# Patient Record
Sex: Female | Born: 1995 | Race: White | Hispanic: No | Marital: Single | Smoking: Never smoker
Health system: Southern US, Community
[De-identification: ages and names within clinical notes are randomized; demographics above are authoritative.]

## PROBLEM LIST (undated history)

## (undated) DIAGNOSIS — F909 Attention-deficit hyperactivity disorder, unspecified type: Secondary | ICD-10-CM

## (undated) DIAGNOSIS — R51 Headache: Secondary | ICD-10-CM

## (undated) DIAGNOSIS — T7840XA Allergy, unspecified, initial encounter: Secondary | ICD-10-CM

## (undated) HISTORY — DX: Headache: R51

## (undated) HISTORY — DX: Allergy, unspecified, initial encounter: T78.40XA

## (undated) HISTORY — PX: WISDOM TOOTH EXTRACTION: SHX21

## (undated) HISTORY — DX: Attention-deficit hyperactivity disorder, unspecified type: F90.9

## (undated) HISTORY — PX: TYMPANOSTOMY TUBE PLACEMENT: SHX32

---
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
## 2015-01-08 ENCOUNTER — Other Ambulatory Visit: Payer: Self-pay | Admitting: Family

## 2015-04-04 ENCOUNTER — Encounter: Payer: Self-pay | Admitting: Neurology

## 2015-04-04 ENCOUNTER — Ambulatory Visit (INDEPENDENT_AMBULATORY_CARE_PROVIDER_SITE_OTHER): Payer: BLUE CROSS/BLUE SHIELD | Admitting: Neurology

## 2015-04-04 VITALS — BP 112/64 | Ht 68.0 in | Wt 112.4 lb

## 2015-04-04 DIAGNOSIS — G44209 Tension-type headache, unspecified, not intractable: Secondary | ICD-10-CM | POA: Diagnosis not present

## 2015-04-04 DIAGNOSIS — M6248 Contracture of muscle, other site: Secondary | ICD-10-CM | POA: Diagnosis not present

## 2015-04-04 DIAGNOSIS — M62838 Other muscle spasm: Secondary | ICD-10-CM

## 2015-04-04 MED ORDER — CYCLOBENZAPRINE HCL 5 MG PO TABS: ORAL_TABLET | ORAL | Status: DC

## 2015-04-04 NOTE — Progress Notes (Signed)
Patient: Ana Bowen MRN: [id number] Sex: female DOB: 06-Jan-1996  Provider: Keturah ShaversNABIZADEH, Yamin Swingler, MD [facility]  Note type: Routine return visit  Referral Source: Dr. Juluis RainierElizabeth Barnes [facility]: [facility] and mother Chief Complaint: Tension headaches  History of Present Illness: Ana Bowen is a 19 y.o. female is here for follow-up management of headache and muscle spasm. She has history of migraine and tension-type headaches as well as anxiety and cervical muscle spasm for which she has been on treatment with Flexeril as a muscle relaxant and also she was on headache preventive medication the past but it was discontinued since she was doing significantly better with no headaches. She was last seen in February 2016 when at that point the headache preventive medication was already discontinued.  Over the past several months she has been doing fairly well with very low-dose of Flexeril at 5 mg every night with occasional headaches but she has been having occasional episodes of upper and lower back pain with mostly paraspinal muscle spasm particularly when she was not taking Flexeril. She was seen by orthopedic service at some point without any specific findings. She ran out of Flexeril last month and since then she was having more muscle spasm in her paraspinal area upper and lower as well as more frequent tension type headaches for which she had to take OTC medications frequently. She does not have any other symptoms with the headaches such as nausea or vomiting, blurry vision or photophobia. She is having episodes of anxiety issues and stress. She is able to sleep well through the night. She has had no recent sports injury or trauma. She is doing fairly well academically at school and has no other concerns.  Review of Systems: 12 system review as per HPI, otherwise negative.  Past Medical History  Diagnosis Date  . Allergy   .  Headache(784.0)   . ADHD (attention deficit hyperactivity disorder)    Surgical History Past Surgical History  Procedure Laterality Date  . Tympanostomy tube placement      Family History family history includes ADD / ADHD in her mother; Anxiety disorder in her paternal grandmother; Cancer in her paternal grandfather; Depression in her other and paternal grandmother; Heart disease in her maternal grandfather; Migraines in her paternal grandmother.  Social History Social History   Social History  . Marital Status: Single    Spouse Name: N/A  . Number of Children: N/A  . Years of Education: N/A   Social History Main Topics  . Smoking status: Never Smoker   . Smokeless tobacco: Never Used  . Alcohol Use: No  . Drug Use: No  . Sexual Activity: No   Other Topics Concern  . 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 She lives on campus and spends school breaks at her parent's home. She has a younger sister.     The medication list was reviewed and reconciled. All changes or newly prescribed medications were explained.  A complete medication list was provided to the patient/caregiver.  Allergies  Allergen Reactions  . Codeine Rash    Physical Exam BP 112/64 mmHg  Ht 5\' 8"  (1.727 m)  Wt 112 lb 6.4 oz (50.984 kg)  BMI 17.09 kg/m2  LMP 04/02/2015 (Exact Date) Gen: Awake, alert, not in distress Skin: No rash, No neurocutaneous stigmata. HEENT: Normocephalic,  no conjunctival injection, nares patent, mucous membranes moist, oropharynx  clear. Neck: Supple, no meningismus. No focal tenderness. Resp: Clear to auscultation bilaterally CV: Regular rate, normal S1/S2, no murmurs, no rubs Abd: BS present, abdomen soft, non-tender, non-distended. No hepatosplenomegaly or mass Ext: Warm and well-perfused. No deformities, no muscle wasting, ROM full.  Neurological Examination: MS: Awake, alert, interactive.  Normal eye contact, answered the questions appropriately, speech was fluent,  Normal comprehension.  Attention and concentration were normal. Cranial Nerves: Pupils were equal and reactive to light ( 5-2mm);  normal fundoscopic exam with sharp discs, visual field full with confrontation test; EOM normal, no nystagmus; no ptsosis, no double vision, intact facial sensation, face symmetric with full strength of facial muscles, hearing intact to finger rub bilaterally, palate elevation is symmetric, tongue protrusion is symmetric with full movement to both sides.  Sternocleidomastoid and trapezius are with normal strength. Tone-Normal Strength-Normal strength in all muscle groups DTRs-  Biceps Triceps Brachioradialis Patellar Ankle  R 2+ 2+ 2+ 2+ 2+  L 2+ 2+ 2+ 2+ 2+   Plantar responses flexor bilaterally, no clonus noted Sensation: Intact to light touch,  Romberg negative. Coordination: No dysmetria on FTN test. No difficulty with balance. Gait: Normal walk and run. Tandem gait was normal. Was able to perform toe walking and heel walking without difficulty.   Assessment and Plan 1. Tension headache   2. Muscle spasms of neck    This is a 19 year old young lady with history of migraine and tension-type headaches as well as paraspinal muscle spasm in cervical area and recently in lower back as well as more recent tension type headaches since she stopped taking Flexeril last month. She has no focal findings on her neurological examination with no focal tenderness and no abnormality on funduscopy exam. Since she was doing better on low-dose Flexeril, I will send her prescription to restart Flexeril and slightly increase the dose of the medication to 5 mg twice a day. I also recommend to continue with appropriate hydration and sleep and limited screen time and also restart magnesium as a dietary supplements. I would not start her on headache preventive medication and will wait and see how she does with  the muscle relaxant education. If she continues with more frequent headaches in the next few weeks, she will call the office and I will start her on amitriptyline or propranolol as a preventive medication for headache area If there is more cervical or back pain or more frequent headaches even I may consider a brain and lower back MRI although I do not think she needs this test at this point. I would like to see her in 6 months for follow-up visit but she will call me at any time if there is any new concern. She and her mother understood and agreed with the plan.  Meds ordered this encounter  Medications  . DISCONTD: cyclobenzaprine (FLEXERIL) 5 MG tablet    Sig: TAKE 1 TABLET (5 MG TOTAL) twice a day PO    Dispense:  60 tablet    Refill:  0    Needs an appointment in order to authorize future refills.  . cyclobenzaprine (FLEXERIL) 5 MG tablet    Sig: TAKE 1 TABLET (5 MG TOTAL) twice a day PO    Dispense:  180 tablet    Refill:  2

## 2015-05-10 ENCOUNTER — Other Ambulatory Visit: Payer: Self-pay | Admitting: Neurology

## 2015-10-01 DIAGNOSIS — J029 Acute pharyngitis, unspecified: Secondary | ICD-10-CM | POA: Diagnosis not present

## 2015-11-07 DIAGNOSIS — Z79899 Other long term (current) drug therapy: Secondary | ICD-10-CM | POA: Diagnosis not present

## 2015-11-07 DIAGNOSIS — F900 Attention-deficit hyperactivity disorder, predominantly inattentive type: Secondary | ICD-10-CM | POA: Diagnosis not present

## 2016-03-18 ENCOUNTER — Other Ambulatory Visit: Payer: Self-pay | Admitting: Family

## 2016-03-19 ENCOUNTER — Encounter (INDEPENDENT_AMBULATORY_CARE_PROVIDER_SITE_OTHER): Payer: Self-pay | Admitting: Neurology

## 2016-03-19 ENCOUNTER — Ambulatory Visit (INDEPENDENT_AMBULATORY_CARE_PROVIDER_SITE_OTHER): Payer: BLUE CROSS/BLUE SHIELD | Admitting: Neurology

## 2016-03-19 VITALS — BP 118/72 | Ht 67.75 in | Wt 110.0 lb

## 2016-03-19 DIAGNOSIS — G44209 Tension-type headache, unspecified, not intractable: Secondary | ICD-10-CM | POA: Diagnosis not present

## 2016-03-19 DIAGNOSIS — G43009 Migraine without aura, not intractable, without status migrainosus: Secondary | ICD-10-CM | POA: Diagnosis not present

## 2016-03-19 DIAGNOSIS — M62838 Other muscle spasm: Secondary | ICD-10-CM | POA: Diagnosis not present

## 2016-03-19 MED ORDER — CYCLOBENZAPRINE HCL 5 MG PO TABS: 5.0000 mg | ORAL_TABLET | Freq: Two times a day (BID) | ORAL | 6 refills | Status: DC

## 2016-03-19 NOTE — Progress Notes (Signed)
Patient: Ana Bowen MRN: [id number] Sex: female DOB: 12/14/1995  Provider: Keturah ShaversNABIZADEH, Gevon Markus, MD [facility]  Note type: Routine return visit  Referral Source: Marcene CorningLouise Twiselton, MD [facility] Chief Complaint: Tension headache  History of Present Illness: Ana Bowen is a 20 y.o. female is here for follow-up management of headache, muscle spasm and anxiety. She was treated previously with headache preventive medication for a while and since she was doing better she was taken off of medication but she was started on small dose of muscle relaxant medication to help with muscle spasm and occasional headache. She was last seen in December 2016 and since then she has been doing fairly well with episodes of occasional headaches, on average once a month although her headaches may continue for 2 or 3 days and needed a few doses of OTC medications. Occasionally she may have some neck pain or back pain with headaches but she does not have any other symptoms such as nausea or vomiting, dizziness or visual changes except for mild photophobia usually continues with headache for a few days. She usually sleeps well without any difficulty and with no awakening headaches. She is doing well academically of the school and she has no other complaints or concerns.  Review of Systems: 12 system review as per HPI, otherwise negative.  Past Medical History:  Diagnosis Date  . ADHD (attention deficit hyperactivity disorder)   . Allergy   . Headache(784.0)    Hospitalizations: No., Head Injury: No., Nervous System Infections: No., Immunizations up to date: Yes.    Surgical History Past Surgical History:  Procedure Laterality Date  . TYMPANOSTOMY TUBE PLACEMENT    . WISDOM TOOTH EXTRACTION      Family History family history includes ADD / ADHD in her mother; Anxiety disorder in her paternal grandmother; Cancer in her paternal grandfather;  Depression in her other and paternal grandmother; Heart disease in her maternal grandfather; Migraines in her paternal grandmother.   Social History Social History   Social History  . Marital status: Single    Spouse name: N/A  . Number of children: N/A  . Years of education: N/A   Social History Main Topics  . Smoking status: Never Smoker  . Smokeless tobacco: Never Used  . Alcohol use No  . Drug use: No  . Sexual activity: No   Other Topics Concern  . 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 She lives [place] campus and spends school breaks at her parent's home. She has a younger sister.     The medication list was reviewed and reconciled. All changes or newly prescribed medications were explained.  A complete medication list was provided to the patient/caregiver.  Allergies  Allergen Reactions  . Codeine Rash    Physical Exam BP 118/72   Ht 5' 7.75" (1.721 m)   Wt 110 lb (49.9 kg)   LMP 03/03/2016 (Within Days)   BMI 16.85 kg/m  Gen: Awake, alert, not in distress Skin: No rash, No neurocutaneous stigmata. HEENT: Normocephalic,  no conjunctival injection, nares patent, mucous membranes moist, oropharynx clear. Neck: Supple, no meningismus. No focal tenderness. Resp: Clear to auscultation bilaterally CV: Regular rate, normal S1/S2, no murmurs, no rubs Abd: BS present, abdomen soft, non-tender, non-distended. No hepatosplenomegaly or mass Ext: Warm and well-perfused. No deformities, no muscle wasting,  Neurological Examination: MS: Awake, alert, interactive. Normal eye  contact, answered the questions appropriately, speech was fluent,  Normal comprehension.  Attention and concentration were normal. Cranial Nerves: Pupils were equal and reactive to light ( 5-523mm);  normal fundoscopic exam with sharp discs, visual field full with confrontation test; EOM normal, no nystagmus; no ptsosis, no double  vision, intact facial sensation, face symmetric with full strength of facial muscles, hearing intact to finger rub bilaterally, palate elevation is symmetric, tongue protrusion is symmetric with full movement to both sides.  Sternocleidomastoid and trapezius are with normal strength. Tone-Normal Strength-Normal strength in all muscle groups DTRs-  Biceps Triceps Brachioradialis Patellar Ankle  R 2+ 2+ 2+ 2+ 2+  L 2+ 2+ 2+ 2+ 2+   Plantar responses flexor bilaterally, no clonus noted Sensation: Intact to light touch, Romberg negative. Coordination: No dysmetria on FTN test. No difficulty with balance. Gait: Normal walk and run. Tandem gait was normal. Was able to perform toe walking and heel walking without difficulty.   Assessment and Plan 1. Tension headache   2. Muscle spasms of neck   3. Migraine without aura and without status migrainosus, not intractable    This is a 20 year old female with history of migraine and tension-type headaches as well as episodes of cervical muscle spasm and pain with fairly good control on low-dose Flexeril as a muscle relaxant medication which also help with her headaches. She is not on any preventive medication at this point and has not been taking frequent OTC medications. She has no focal findings on her neurological examination. Recommended to continue Flexeril at the same dose of 5 mg twice a day although I mentioned during her headaches, she may take an extra dose of Flexeril and on the other side if she continues to be symptom free for longer period of time, she may gradually and slightly decreased the dose of Flexeril as she tolerates. During starting of headaches, I recommended to take appropriate dose of OTC medications such as 600 mg of ibuprofen or 2 tablets of Aleve at the beginning of her symptoms to prevent from long lasting symptoms. I would like to see her in 7 months for follow-up visit or sooner if she develops more frequent headaches. She  understood and agreed with the plan.   Meds ordered this encounter  Medications  . TARINA FE 1/20 1-20 MG-MCG tablet    Refill:  10  . cyclobenzaprine (FLEXERIL) 5 MG tablet    Sig: Take 1 tablet (5 mg total) by mouth 2 (two) times daily.    Dispense:  60 tablet    Refill:  6

## 2016-03-19 NOTE — Patient Instructions (Signed)
In case of moderate to severe headache, take 600 MG of Advil or 2 tablets of Aleve at the beginning of your symptoms. Drink more water and you may take an extra Flexeril if there is any neck pain or muscle spasm.

## 2016-03-20 DIAGNOSIS — F900 Attention-deficit hyperactivity disorder, predominantly inattentive type: Secondary | ICD-10-CM | POA: Diagnosis not present

## 2016-04-05 DIAGNOSIS — R6889 Other general symptoms and signs: Secondary | ICD-10-CM | POA: Diagnosis not present

## 2016-04-05 DIAGNOSIS — R69 Illness, unspecified: Secondary | ICD-10-CM | POA: Diagnosis not present

## 2016-06-17 DIAGNOSIS — F419 Anxiety disorder, unspecified: Secondary | ICD-10-CM | POA: Diagnosis not present

## 2016-07-29 DIAGNOSIS — F411 Generalized anxiety disorder: Secondary | ICD-10-CM | POA: Diagnosis not present

## 2016-09-30 DIAGNOSIS — L989 Disorder of the skin and subcutaneous tissue, unspecified: Secondary | ICD-10-CM | POA: Diagnosis not present

## 2016-10-08 DIAGNOSIS — F900 Attention-deficit hyperactivity disorder, predominantly inattentive type: Secondary | ICD-10-CM | POA: Diagnosis not present

## 2016-10-08 DIAGNOSIS — F419 Anxiety disorder, unspecified: Secondary | ICD-10-CM | POA: Diagnosis not present

## 2016-10-17 ENCOUNTER — Other Ambulatory Visit (INDEPENDENT_AMBULATORY_CARE_PROVIDER_SITE_OTHER): Payer: Self-pay | Admitting: Neurology

## 2016-10-19 NOTE — Telephone Encounter (Signed)
Please review and if you approve I will refill and notify family follow up appointment is due this month.

## 2016-11-15 ENCOUNTER — Other Ambulatory Visit (INDEPENDENT_AMBULATORY_CARE_PROVIDER_SITE_OTHER): Payer: Self-pay | Admitting: Neurology

## 2016-12-12 ENCOUNTER — Other Ambulatory Visit (INDEPENDENT_AMBULATORY_CARE_PROVIDER_SITE_OTHER): Payer: Self-pay | Admitting: Neurology

## 2016-12-14 NOTE — Telephone Encounter (Signed)
Medication Refill denied Letter sent unable to contact at 2 of the the above numbers. Patient needs OV and to transition to adult neurology.

## 2016-12-24 DIAGNOSIS — J029 Acute pharyngitis, unspecified: Secondary | ICD-10-CM | POA: Diagnosis not present

## 2017-01-08 ENCOUNTER — Ambulatory Visit (INDEPENDENT_AMBULATORY_CARE_PROVIDER_SITE_OTHER): Payer: BLUE CROSS/BLUE SHIELD | Admitting: Neurology

## 2017-02-17 DIAGNOSIS — R3915 Urgency of urination: Secondary | ICD-10-CM | POA: Diagnosis not present

## 2017-05-05 DIAGNOSIS — R112 Nausea with vomiting, unspecified: Secondary | ICD-10-CM | POA: Diagnosis not present

## 2017-05-05 DIAGNOSIS — M545 Low back pain: Secondary | ICD-10-CM | POA: Diagnosis not present

## 2017-05-05 DIAGNOSIS — N3001 Acute cystitis with hematuria: Secondary | ICD-10-CM | POA: Diagnosis not present

## 2017-05-05 DIAGNOSIS — R1084 Generalized abdominal pain: Secondary | ICD-10-CM | POA: Diagnosis not present

## 2017-05-05 DIAGNOSIS — R3129 Other microscopic hematuria: Secondary | ICD-10-CM | POA: Diagnosis not present

## 2017-05-13 DIAGNOSIS — R1084 Generalized abdominal pain: Secondary | ICD-10-CM | POA: Diagnosis not present

## 2017-05-13 DIAGNOSIS — N200 Calculus of kidney: Secondary | ICD-10-CM | POA: Diagnosis not present

## 2017-05-13 DIAGNOSIS — R3129 Other microscopic hematuria: Secondary | ICD-10-CM | POA: Diagnosis not present

## 2017-06-08 DIAGNOSIS — Z23 Encounter for immunization: Secondary | ICD-10-CM | POA: Diagnosis not present

## 2017-06-08 DIAGNOSIS — R070 Pain in throat: Secondary | ICD-10-CM | POA: Diagnosis not present

## 2017-08-26 DIAGNOSIS — J0300 Acute streptococcal tonsillitis, unspecified: Secondary | ICD-10-CM | POA: Diagnosis not present

## 2017-09-27 DIAGNOSIS — N390 Urinary tract infection, site not specified: Secondary | ICD-10-CM | POA: Diagnosis not present

## 2017-10-11 DIAGNOSIS — N200 Calculus of kidney: Secondary | ICD-10-CM | POA: Diagnosis not present

## 2017-10-11 DIAGNOSIS — R310 Gross hematuria: Secondary | ICD-10-CM | POA: Diagnosis not present

## 2017-10-11 DIAGNOSIS — R825 Elevated urine levels of drugs, medicaments and biological substances: Secondary | ICD-10-CM | POA: Diagnosis not present

## 2017-10-21 DIAGNOSIS — R87612 Low grade squamous intraepithelial lesion on cytologic smear of cervix (LGSIL): Secondary | ICD-10-CM | POA: Diagnosis not present

## 2017-10-21 DIAGNOSIS — Z01419 Encounter for gynecological examination (general) (routine) without abnormal findings: Secondary | ICD-10-CM | POA: Diagnosis not present

## 2017-10-21 DIAGNOSIS — Z3041 Encounter for surveillance of contraceptive pills: Secondary | ICD-10-CM | POA: Diagnosis not present

## 2017-10-21 DIAGNOSIS — Z113 Encounter for screening for infections with a predominantly sexual mode of transmission: Secondary | ICD-10-CM | POA: Diagnosis not present

## 2017-11-04 DIAGNOSIS — N200 Calculus of kidney: Secondary | ICD-10-CM | POA: Diagnosis not present

## 2017-11-04 DIAGNOSIS — R110 Nausea: Secondary | ICD-10-CM | POA: Diagnosis not present

## 2017-11-04 DIAGNOSIS — Z23 Encounter for immunization: Secondary | ICD-10-CM | POA: Diagnosis not present

## 2017-11-04 DIAGNOSIS — F419 Anxiety disorder, unspecified: Secondary | ICD-10-CM | POA: Diagnosis not present

## 2017-11-22 DIAGNOSIS — N200 Calculus of kidney: Secondary | ICD-10-CM | POA: Diagnosis not present

## 2017-11-24 DIAGNOSIS — R1013 Epigastric pain: Secondary | ICD-10-CM | POA: Diagnosis not present

## 2017-11-24 DIAGNOSIS — N3001 Acute cystitis with hematuria: Secondary | ICD-10-CM | POA: Diagnosis not present

## 2018-01-20 DIAGNOSIS — R590 Localized enlarged lymph nodes: Secondary | ICD-10-CM | POA: Diagnosis not present

## 2018-02-14 ENCOUNTER — Other Ambulatory Visit: Payer: Self-pay | Admitting: Family Medicine

## 2018-02-14 DIAGNOSIS — R590 Localized enlarged lymph nodes: Secondary | ICD-10-CM | POA: Diagnosis not present

## 2018-02-17 ENCOUNTER — Ambulatory Visit
Admission: RE | Admit: 2018-02-17 | Discharge: 2018-02-17 | Disposition: A | Discharge status: Home / Self Care | Payer: BLUE CROSS/BLUE SHIELD | Source: Ambulatory Visit | Attending: Family Medicine | Admitting: Family Medicine

## 2018-02-17 DIAGNOSIS — R590 Localized enlarged lymph nodes: Secondary | ICD-10-CM | POA: Diagnosis not present

## 2018-05-31 DIAGNOSIS — F411 Generalized anxiety disorder: Secondary | ICD-10-CM | POA: Diagnosis not present

## 2018-11-30 DIAGNOSIS — Z79899 Other long term (current) drug therapy: Secondary | ICD-10-CM | POA: Diagnosis not present

## 2018-12-23 DIAGNOSIS — Z681 Body mass index (BMI) 19 or less, adult: Secondary | ICD-10-CM | POA: Diagnosis not present

## 2018-12-23 DIAGNOSIS — Z20828 Contact with and (suspected) exposure to other viral communicable diseases: Secondary | ICD-10-CM | POA: Diagnosis not present

## 2018-12-29 DIAGNOSIS — B342 Coronavirus infection, unspecified: Secondary | ICD-10-CM | POA: Diagnosis not present

## 2019-02-09 IMAGING — US US SOFT TISSUE HEAD/NECK
1 series · 14 of 20 positions shown · non-contrast
Comparison: None.

CLINICAL DATA: Persistent anterior cervical adenopathy.

EXAM:
ULTRASOUND OF HEAD/NECK SOFT TISSUES
TECHNIQUE: Ultrasound examination of the head and neck soft tissues was
performed in the area of clinical concern.

[Series 1: us soft tissue head/neck · 0.06mm/px · 14 of 20 slices shown]
[im 1/20]
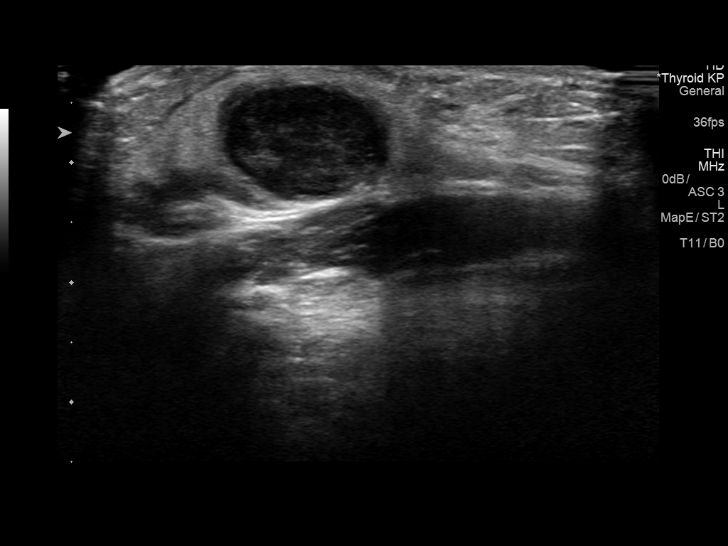
[im 3/20]
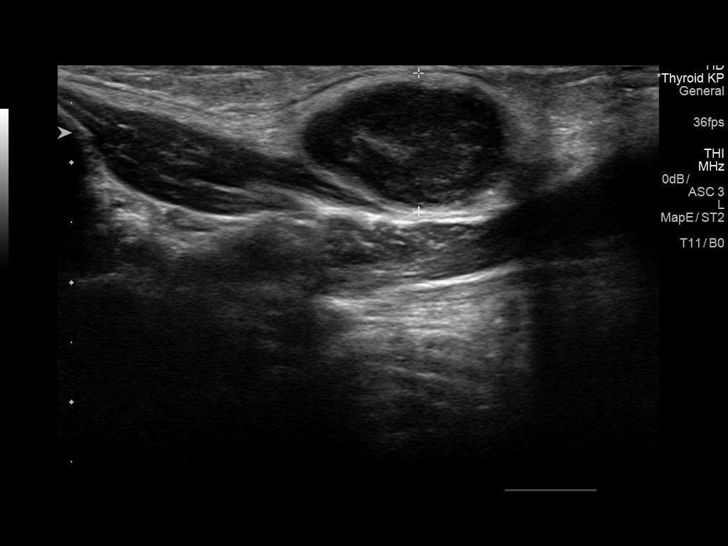
[im 4/20]
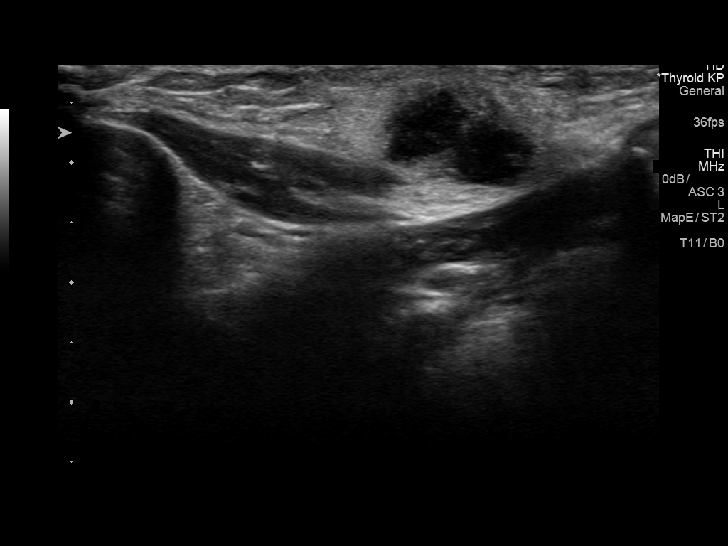
[im 6/20]
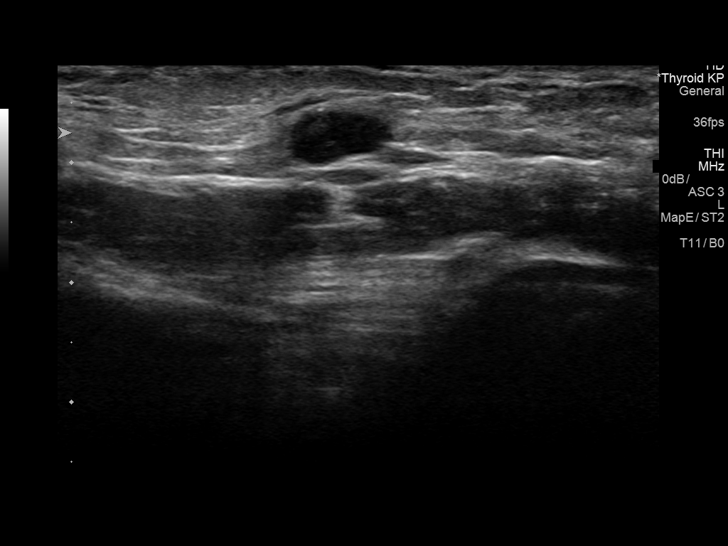
[im 7/20]
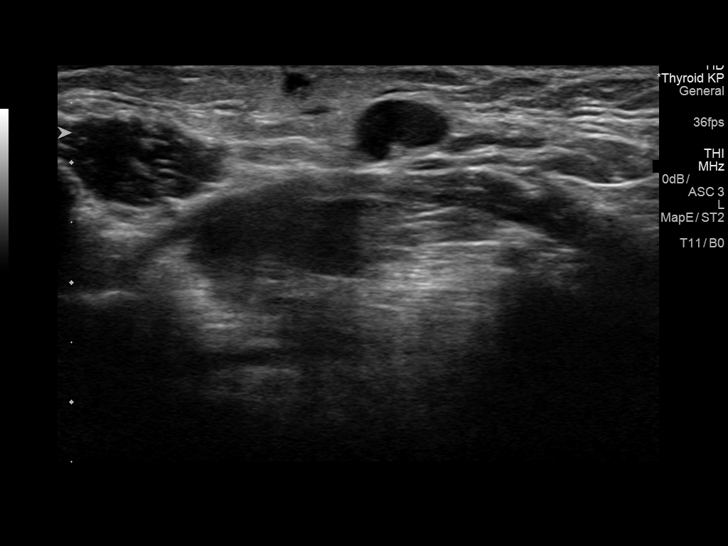
[im 8/20]
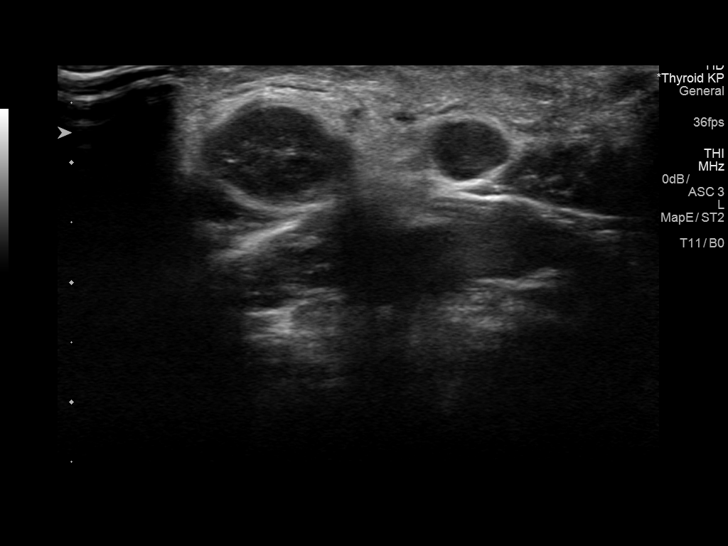
[im 10/20]
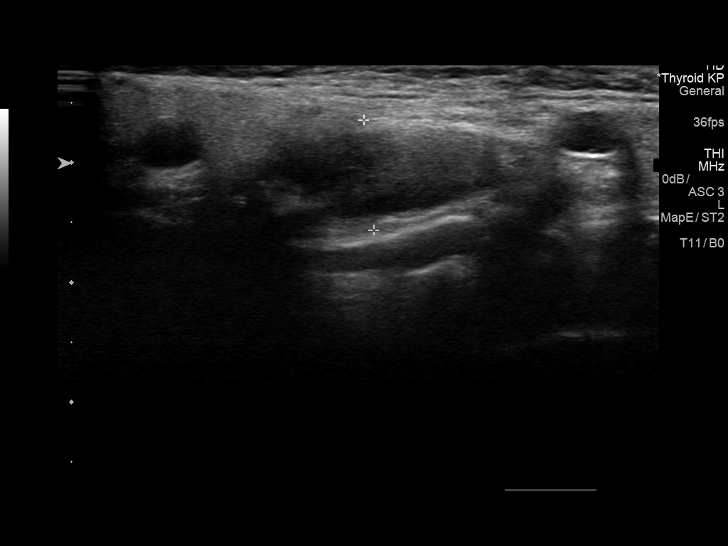
[im 11/20]
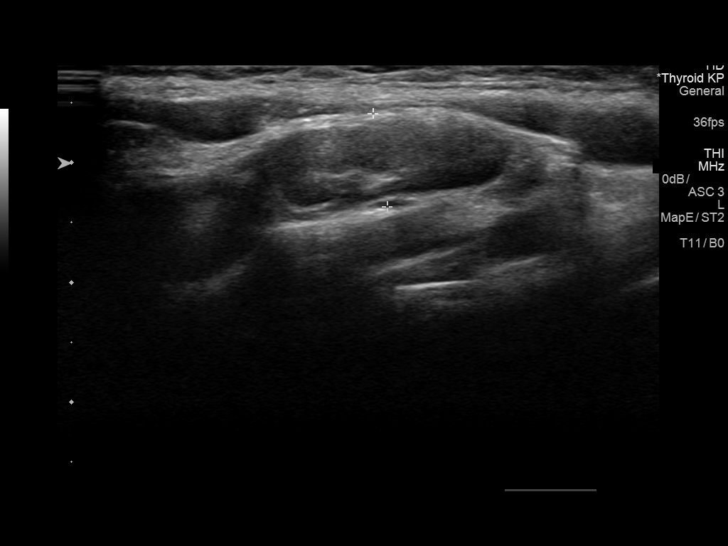
[im 13/20]
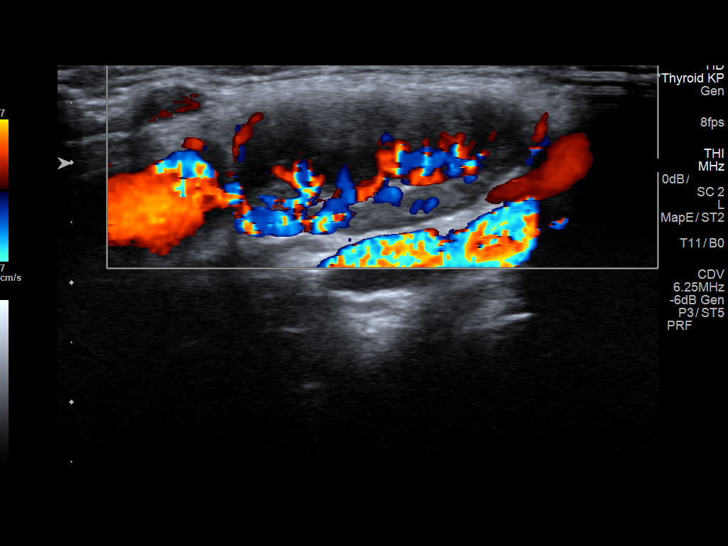
[im 14/20]
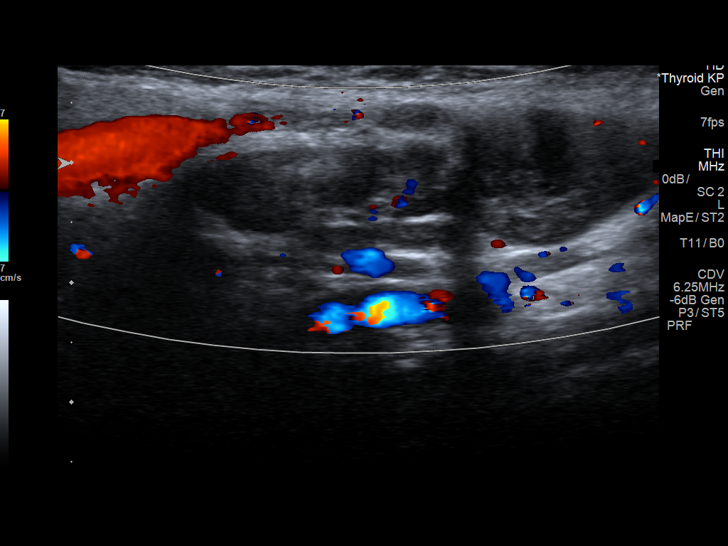
[im 16/20]
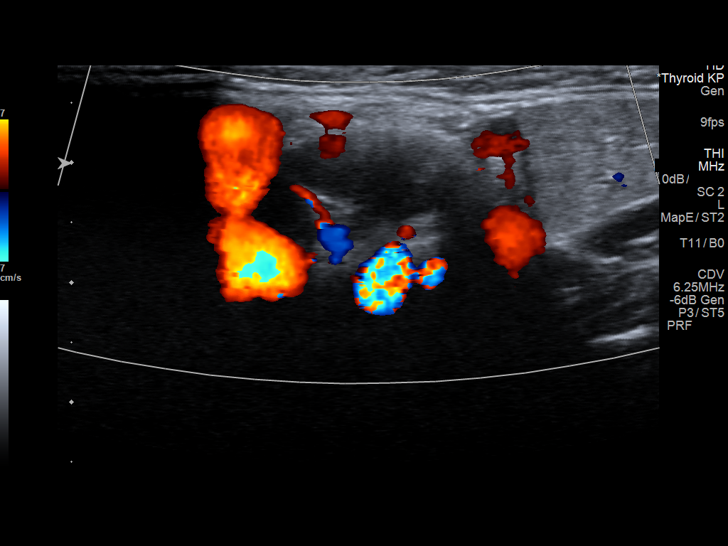
[im 17/20]
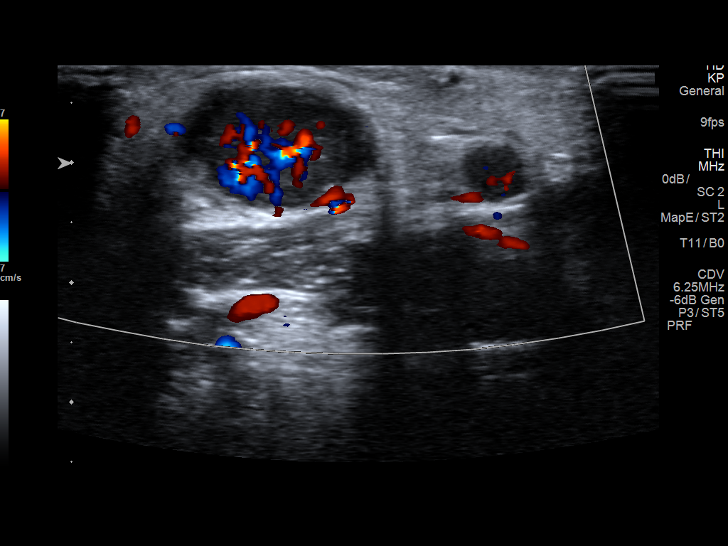
[im 18/20]
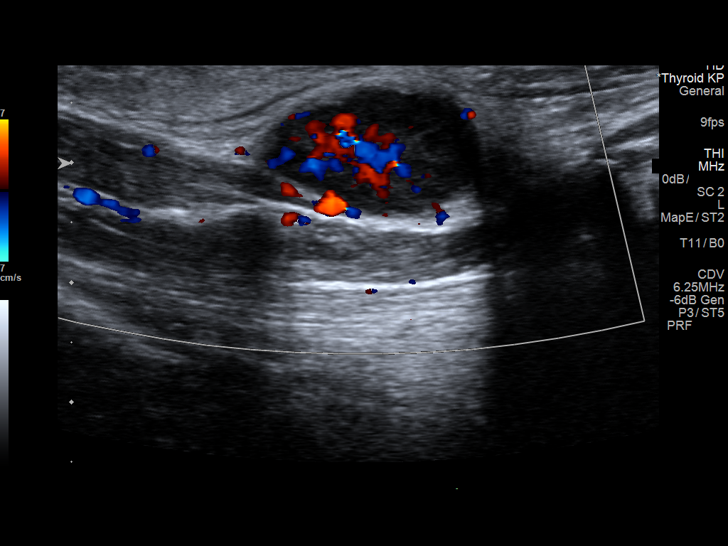
[im 20/20]
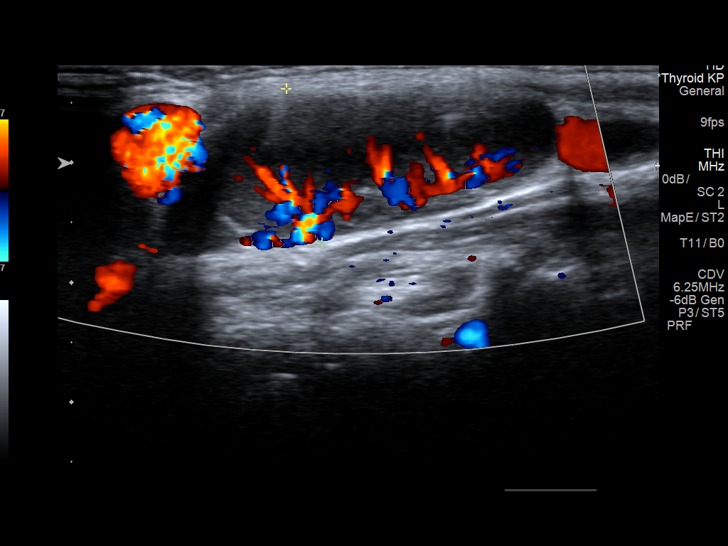

[14 of 20 positions shown; findings below may reference images not displayed]

FINDINGS: Three soft tissue densities are identified consistent with lymph
nodes. The largest measures 1.3 cm short axis, RIGHT level II.
Another level I lymph node in the midline, measures 1.2 cm short
axis. A third lymph node, probably level II, on the RIGHT, measures
0.8 cm short axis.
IMPRESSION: Sonographic features consistent with RIGHT cervical lymphadenopathy.
In this age group, infectious or reactive inflammatory nodes are
statistically most likely. If lymphadenopathy persists, and/or
further investigation desired, CT neck with contrast could provide
additional information.

## 2019-05-30 DIAGNOSIS — F411 Generalized anxiety disorder: Secondary | ICD-10-CM | POA: Diagnosis not present

## 2019-05-30 DIAGNOSIS — F900 Attention-deficit hyperactivity disorder, predominantly inattentive type: Secondary | ICD-10-CM | POA: Diagnosis not present
